# Patient Record
Sex: Female | Born: 1965 | Race: White | Hispanic: No | Smoking: Current every day smoker
Health system: Southern US, Community
[De-identification: ages and names within clinical notes are randomized; demographics above are authoritative.]

## PROBLEM LIST (undated history)

## (undated) DIAGNOSIS — I10 Essential (primary) hypertension: Secondary | ICD-10-CM

---

## 1997-08-12 ENCOUNTER — Other Ambulatory Visit: Admission: RE | Admit: 1997-08-12 | Discharge: 1997-08-12 | Payer: Self-pay | Admitting: Obstetrics and Gynecology

## 1998-03-31 ENCOUNTER — Inpatient Hospital Stay (HOSPITAL_COMMUNITY): Admission: AD | Admit: 1998-03-31 | Discharge: 1998-04-03 | Payer: Self-pay | Admitting: Obstetrics and Gynecology

## 1998-05-02 ENCOUNTER — Other Ambulatory Visit: Admission: RE | Admit: 1998-05-02 | Discharge: 1998-05-02 | Payer: Self-pay | Admitting: Obstetrics & Gynecology

## 1998-12-30 ENCOUNTER — Other Ambulatory Visit: Admission: RE | Admit: 1998-12-30 | Discharge: 1998-12-30 | Payer: Self-pay | Admitting: Gynecology

## 1999-04-24 ENCOUNTER — Encounter: Payer: Self-pay | Admitting: Emergency Medicine

## 1999-04-24 ENCOUNTER — Emergency Department (HOSPITAL_COMMUNITY): Admission: EM | Admit: 1999-04-24 | Discharge: 1999-04-24 | Payer: Self-pay | Admitting: Emergency Medicine

## 2000-06-21 ENCOUNTER — Other Ambulatory Visit: Admission: RE | Admit: 2000-06-21 | Discharge: 2000-06-21 | Payer: Self-pay | Admitting: Obstetrics and Gynecology

## 2001-01-10 ENCOUNTER — Inpatient Hospital Stay (HOSPITAL_COMMUNITY): Admission: AD | Admit: 2001-01-10 | Discharge: 2001-01-12 | Payer: Self-pay | Admitting: Obstetrics and Gynecology

## 2001-05-29 ENCOUNTER — Ambulatory Visit (HOSPITAL_BASED_OUTPATIENT_CLINIC_OR_DEPARTMENT_OTHER): Admission: RE | Admit: 2001-05-29 | Discharge: 2001-05-29 | Payer: Self-pay | Admitting: Plastic Surgery

## 2001-10-13 ENCOUNTER — Other Ambulatory Visit: Admission: RE | Admit: 2001-10-13 | Discharge: 2001-10-13 | Payer: Self-pay | Admitting: Obstetrics and Gynecology

## 2002-05-28 ENCOUNTER — Ambulatory Visit (HOSPITAL_COMMUNITY): Admission: RE | Admit: 2002-05-28 | Discharge: 2002-05-28 | Payer: Self-pay | Admitting: Obstetrics & Gynecology

## 2003-01-13 ENCOUNTER — Other Ambulatory Visit: Admission: RE | Admit: 2003-01-13 | Discharge: 2003-01-13 | Payer: Self-pay | Admitting: Obstetrics and Gynecology

## 2004-06-07 ENCOUNTER — Ambulatory Visit (HOSPITAL_COMMUNITY): Admission: RE | Admit: 2004-06-07 | Discharge: 2004-06-07 | Payer: Self-pay | Admitting: Obstetrics and Gynecology

## 2004-06-08 ENCOUNTER — Inpatient Hospital Stay (HOSPITAL_COMMUNITY): Admission: AD | Admit: 2004-06-08 | Discharge: 2004-06-10 | Payer: Self-pay | Admitting: Obstetrics and Gynecology

## 2004-07-11 ENCOUNTER — Other Ambulatory Visit: Admission: RE | Admit: 2004-07-11 | Discharge: 2004-07-11 | Payer: Self-pay | Admitting: Obstetrics and Gynecology

## 2010-05-17 ENCOUNTER — Other Ambulatory Visit: Payer: Self-pay | Admitting: Obstetrics and Gynecology

## 2011-02-02 ENCOUNTER — Emergency Department (HOSPITAL_COMMUNITY): Payer: BC Managed Care – PPO

## 2011-02-02 ENCOUNTER — Emergency Department (HOSPITAL_COMMUNITY)
Admission: EM | Admit: 2011-02-02 | Discharge: 2011-02-03 | Disposition: A | Discharge status: Home / Self Care | Payer: BC Managed Care – PPO | Attending: Emergency Medicine | Admitting: Emergency Medicine

## 2011-02-02 DIAGNOSIS — X500XXA Overexertion from strenuous movement or load, initial encounter: Secondary | ICD-10-CM | POA: Insufficient documentation

## 2011-02-02 DIAGNOSIS — S92309A Fracture of unspecified metatarsal bone(s), unspecified foot, initial encounter for closed fracture: Secondary | ICD-10-CM | POA: Insufficient documentation

## 2011-02-02 DIAGNOSIS — M79609 Pain in unspecified limb: Secondary | ICD-10-CM | POA: Insufficient documentation

## 2013-01-06 ENCOUNTER — Other Ambulatory Visit: Payer: Self-pay | Admitting: Obstetrics and Gynecology

## 2015-02-16 ENCOUNTER — Other Ambulatory Visit: Payer: Self-pay | Admitting: Obstetrics and Gynecology

## 2015-02-18 LAB — CYTOLOGY - PAP

## 2015-02-22 ENCOUNTER — Other Ambulatory Visit: Payer: Self-pay | Admitting: Obstetrics and Gynecology

## 2015-02-22 DIAGNOSIS — R928 Other abnormal and inconclusive findings on diagnostic imaging of breast: Secondary | ICD-10-CM

## 2015-02-28 ENCOUNTER — Ambulatory Visit
Admission: RE | Admit: 2015-02-28 | Discharge: 2015-02-28 | Disposition: A | Discharge status: Home / Self Care | Payer: BLUE CROSS/BLUE SHIELD | Source: Ambulatory Visit | Attending: Obstetrics and Gynecology | Admitting: Obstetrics and Gynecology

## 2015-02-28 DIAGNOSIS — R928 Other abnormal and inconclusive findings on diagnostic imaging of breast: Secondary | ICD-10-CM

## 2015-08-31 DIAGNOSIS — Z0000 Encounter for general adult medical examination without abnormal findings: Secondary | ICD-10-CM | POA: Diagnosis not present

## 2015-09-16 DIAGNOSIS — R5383 Other fatigue: Secondary | ICD-10-CM | POA: Diagnosis not present

## 2015-09-16 DIAGNOSIS — I10 Essential (primary) hypertension: Secondary | ICD-10-CM | POA: Diagnosis not present

## 2015-12-05 DIAGNOSIS — M545 Low back pain: Secondary | ICD-10-CM | POA: Diagnosis not present

## 2015-12-05 DIAGNOSIS — F419 Anxiety disorder, unspecified: Secondary | ICD-10-CM | POA: Diagnosis not present

## 2016-04-01 ENCOUNTER — Emergency Department (HOSPITAL_COMMUNITY): Payer: BLUE CROSS/BLUE SHIELD

## 2016-04-01 ENCOUNTER — Encounter (HOSPITAL_COMMUNITY): Payer: Self-pay | Admitting: Emergency Medicine

## 2016-04-01 ENCOUNTER — Emergency Department (HOSPITAL_COMMUNITY)
Admission: EM | Admit: 2016-04-01 | Discharge: 2016-04-01 | Disposition: A | Discharge status: Home / Self Care | Payer: BLUE CROSS/BLUE SHIELD | Attending: Emergency Medicine | Admitting: Emergency Medicine

## 2016-04-01 DIAGNOSIS — S8392XA Sprain of unspecified site of left knee, initial encounter: Secondary | ICD-10-CM | POA: Diagnosis not present

## 2016-04-01 DIAGNOSIS — I10 Essential (primary) hypertension: Secondary | ICD-10-CM | POA: Diagnosis not present

## 2016-04-01 DIAGNOSIS — F17210 Nicotine dependence, cigarettes, uncomplicated: Secondary | ICD-10-CM | POA: Insufficient documentation

## 2016-04-01 DIAGNOSIS — Y999 Unspecified external cause status: Secondary | ICD-10-CM | POA: Diagnosis not present

## 2016-04-01 DIAGNOSIS — X509XXA Other and unspecified overexertion or strenuous movements or postures, initial encounter: Secondary | ICD-10-CM | POA: Insufficient documentation

## 2016-04-01 DIAGNOSIS — S8992XA Unspecified injury of left lower leg, initial encounter: Secondary | ICD-10-CM | POA: Diagnosis not present

## 2016-04-01 DIAGNOSIS — S8391XA Sprain of unspecified site of right knee, initial encounter: Secondary | ICD-10-CM | POA: Diagnosis not present

## 2016-04-01 DIAGNOSIS — Y9301 Activity, walking, marching and hiking: Secondary | ICD-10-CM | POA: Insufficient documentation

## 2016-04-01 DIAGNOSIS — M25561 Pain in right knee: Secondary | ICD-10-CM | POA: Diagnosis not present

## 2016-04-01 DIAGNOSIS — Y929 Unspecified place or not applicable: Secondary | ICD-10-CM | POA: Diagnosis not present

## 2016-04-01 DIAGNOSIS — M25562 Pain in left knee: Secondary | ICD-10-CM | POA: Diagnosis not present

## 2016-04-01 HISTORY — DX: Essential (primary) hypertension: I10

## 2016-04-01 MED ORDER — IBUPROFEN 800 MG PO TABS: 800.0000 mg | ORAL_TABLET | Freq: Three times a day (TID) | ORAL | 0 refills | Status: AC

## 2016-04-01 NOTE — ED Notes (Signed)
Patient unable to tolerate automatic blood pressure cuff bilaterally, unable to get a blood pressure at this time.

## 2016-04-01 NOTE — ED Provider Notes (Signed)
[facility]-EMERGENCY DEPT Provider Note   CSN: [id number] Arrival date & time: 04/01/16  1232  By signing my name below, I, Donna SalisburyJoshua Wood, attest that this documentation has been prepared under the direction and in the presence of non-physician practitioner, Rhea BleacherJosh William Laske PA-C. Electronically Signed: Nelwyn SalisburyJoshua Wood, Scribe. 04/01/2016. 12:54 PM.  History   Chief Complaint Chief Complaint  Patient presents with  . Fall  . Knee Injury   The history is provided by the patient. No language interpreter was used.   HPI Comments:  Donna Wood is a 50 y.o. female with no pertinent pmhx who presents to the Emergency Department complaining of sudden-onset constant unchanged bilateral knee pain s/p fall occurring about 11 hours ago. Pt states that she was walking on a hardwood floor when her left knee gave out, causing her legs to slide from under her and putting her into a split. Pt's pain is exacerbated on walking and palpation. She denies hitting her head at the time of the fall. States she feels unstable on her knees and can only walk taking baby steps.    Past Medical History:  Diagnosis Date  . Hypertension     There are no active problems to display for this patient.   History reviewed. No pertinent surgical history.  OB History    No data available       Home Medications    Prior to Admission medications   Not on File    Family History No family history on file.  Social History Social History  Substance Use Topics  . Smoking status: Current Every Day Smoker    Types: Cigarettes  . Smokeless tobacco: Never Used  . Alcohol use Yes     Comment: once per week      Allergies   Patient has no known allergies.   Review of Systems Review of Systems  Constitutional: Negative for activity change.  Musculoskeletal: Positive for arthralgias and joint swelling. Negative for back pain and neck pain.  Skin: Negative for wound.  Neurological: Negative for weakness and  numbness.     Physical Exam Updated Vital Signs BP 118/78 (BP Location: Left Arm)   Pulse 106   Temp 97.7 F (36.5 C) (Oral)   Resp 18   LMP 04/01/2016   SpO2 97%   Physical Exam  Constitutional: She is oriented to person, place, and time. She appears well-developed and well-nourished. No distress.  HENT:  Head: Normocephalic and atraumatic.  Eyes: Conjunctivae are normal. Pupils are equal, round, and reactive to light.  Neck: Normal range of motion. Neck supple.  Cardiovascular: Normal rate and normal pulses.  Exam reveals no decreased pulses.   Pulmonary/Chest: Effort normal.  Abdominal: She exhibits no distension.  Musculoskeletal: She exhibits tenderness. She exhibits no edema.       Right hip: Normal.       Left hip: Normal.       Right knee: She exhibits normal range of motion and no swelling. Tenderness found. Medial joint line tenderness noted.       Left knee: She exhibits normal range of motion and no swelling. Tenderness found. Medial joint line tenderness noted.       Right ankle: Normal.       Left ankle: Normal.       Right foot: Normal.       Left foot: Normal.  Neurological: She is alert and oriented to person, place, and time. No sensory deficit.  Motor, sensation, and vascular distal to  the injury is fully intact.   Skin: Skin is warm and dry.  Psychiatric: She has a normal mood and affect.  Nursing note and vitals reviewed.    ED Treatments / Results  DIAGNOSTIC STUDIES:  Oxygen Saturation is 97% on RA, normal by my interpretation.    COORDINATION OF CARE:  1:41 PM Discussed treatment plan with pt at bedside which includes imaging and referral to orthopedics and pt agreed to plan.  Radiology Dg Knee Complete 4 Views Left  Result Date: 04/01/2016 CLINICAL DATA:  Left knee pain following an injury last night. EXAM: LEFT KNEE - COMPLETE 4+ VIEW COMPARISON:  None. FINDINGS: Mild tibial spine spur formation. No fracture, dislocation or effusion.  IMPRESSION: No fracture. Electronically Signed   By: Beckie SaltsSteven  Reid M.D.   On: 04/01/2016 13:57   Dg Knee Complete 4 Views Right  Result Date: 04/01/2016 CLINICAL DATA:  Right knee pain following an injury last night. EXAM: RIGHT KNEE - COMPLETE 4+ VIEW COMPARISON:  None. FINDINGS: Small effusion.  No fracture or dislocation seen. IMPRESSION: Small effusion.  No fracture. Electronically Signed   By: Beckie SaltsSteven  Reid M.D.   On: 04/01/2016 13:58    Procedures Procedures (including critical care time)   Initial Impression / Assessment and Plan / ED Course  I have reviewed the triage vital signs and the nursing notes.  Pertinent labs & imaging results that were available during my care of the patient were reviewed by me and considered in my medical decision making (see chart for details).  Clinical Course    Patient seen and examined. X-rays performed.   Vital signs reviewed and are as follows: BP 118/78 (BP Location: Left Arm)   Pulse 95   Temp 97.7 F (36.5 C) (Oral)   Resp 18   LMP 04/01/2016   SpO2 100%   Pt updated on results. No fractures. Will place in knee sleeves. Patient states that she has crutches at home. She is encouraged to use these to help her maintain her stability. Orthopedic referral given. Encouraged follow-up.  Patient was counseled on RICE protocol and told to rest injury, use ice for no longer than 15 minutes every hour, compress the area, and elevate above the level of their heart as much as possible to reduce swelling. Questions answered. Patient verbalized understanding.      Final Clinical Impressions(s) / ED Diagnoses   Final diagnoses:  Sprain of left knee, unspecified ligament, initial encounter  Sprain of right knee, unspecified ligament, initial encounter   Patient with knee injury bilaterally after fall. Imaging neg except for small effusion. LE's are neurovascularly intact. She will likely need ortho f/u 2/2 instability.   New  Prescriptions Discharge Medication List as of 04/01/2016  2:56 PM    START taking these medications   Details  ibuprofen (ADVIL,MOTRIN) 800 MG tablet Take 1 tablet (800 mg total) by mouth 3 (three) times daily., Starting Sun 04/01/2016, Print      I personally performed the services described in this documentation, which was scribed in my presence. The recorded information has been reviewed and is accurate.     Renne CriglerJoshua Kenzlei Runions, PA-C 04/01/16 1537    Melene Planan Floyd, DO 04/01/16 [phone]

## 2016-04-01 NOTE — ED Triage Notes (Signed)
Patient states last night left knee gave out from her causing her to do a split in her socks on hardwood floor.  Patient now c/o worsening left knee pain, new pain in right knee.

## 2016-04-01 NOTE — Discharge Instructions (Signed)
Please read and follow all provided instructions.  Your diagnoses today include:  1. Sprain of left knee, unspecified ligament, initial encounter   2. Sprain of right knee, unspecified ligament, initial encounter     Tests performed today include:  An x-ray of the affected areas - does NOT show any broken bones  Vital signs. See below for your results today.   Medications prescribed:   Ibuprofen (Motrin, Advil) - anti-inflammatory pain medication  Do not exceed 800mg  ibuprofen every 8 hours, take with food  You have been prescribed an anti-inflammatory medication or NSAID. Take with food. Take smallest effective dose for the shortest duration needed for your pain. Stop taking if you experience stomach pain or vomiting.   Take any prescribed medications only as directed.  Home care instructions:   Follow any educational materials contained in this packet  Follow R.I.C.E. Protocol:  R - rest your injury   I  - use ice on injury without applying directly to skin  C - compress injury with bandage or splint  E - elevate the injury as much as possible  Follow-up instructions: Please follow-up with your primary care provider or the provided orthopedic physician this coming week.   Return instructions:   Please return if your toes or feet are numb or tingling, appear gray or blue, or you have severe pain (also elevate the leg and loosen splint or wrap if you were given one)  Please return to the Emergency Department if you experience worsening symptoms.   Please return if you have any other emergent concerns.  Additional Information:  Your vital signs today were: BP 118/78 (BP Location: Left Arm)    Pulse 106    Temp 97.7 F (36.5 C) (Oral)    Resp 18    LMP 04/01/2016    SpO2 97%  If your blood pressure (BP) was elevated above 135/85 this visit, please have this repeated by your doctor within one month. --------------

## 2016-04-06 DIAGNOSIS — M25562 Pain in left knee: Secondary | ICD-10-CM | POA: Diagnosis not present

## 2016-04-06 DIAGNOSIS — M25561 Pain in right knee: Secondary | ICD-10-CM | POA: Diagnosis not present

## 2016-04-20 DIAGNOSIS — M25561 Pain in right knee: Secondary | ICD-10-CM | POA: Diagnosis not present

## 2016-05-21 DIAGNOSIS — M25561 Pain in right knee: Secondary | ICD-10-CM | POA: Diagnosis not present

## 2016-06-20 ENCOUNTER — Other Ambulatory Visit: Payer: Self-pay | Admitting: Obstetrics and Gynecology

## 2016-06-20 DIAGNOSIS — Z124 Encounter for screening for malignant neoplasm of cervix: Secondary | ICD-10-CM | POA: Diagnosis not present

## 2016-06-20 DIAGNOSIS — Z01419 Encounter for gynecological examination (general) (routine) without abnormal findings: Secondary | ICD-10-CM | POA: Diagnosis not present

## 2016-06-20 DIAGNOSIS — Z1231 Encounter for screening mammogram for malignant neoplasm of breast: Secondary | ICD-10-CM | POA: Diagnosis not present

## 2016-06-20 DIAGNOSIS — Z6831 Body mass index (BMI) 31.0-31.9, adult: Secondary | ICD-10-CM | POA: Diagnosis not present

## 2016-06-21 LAB — CYTOLOGY - PAP

## 2016-09-11 DIAGNOSIS — Z5181 Encounter for therapeutic drug level monitoring: Secondary | ICD-10-CM | POA: Diagnosis not present

## 2016-09-11 DIAGNOSIS — L299 Pruritus, unspecified: Secondary | ICD-10-CM | POA: Diagnosis not present

## 2016-09-11 DIAGNOSIS — L509 Urticaria, unspecified: Secondary | ICD-10-CM | POA: Diagnosis not present

## 2016-11-22 DIAGNOSIS — R635 Abnormal weight gain: Secondary | ICD-10-CM | POA: Diagnosis not present

## 2016-11-22 DIAGNOSIS — I10 Essential (primary) hypertension: Secondary | ICD-10-CM | POA: Diagnosis not present

## 2016-11-22 DIAGNOSIS — F419 Anxiety disorder, unspecified: Secondary | ICD-10-CM | POA: Diagnosis not present

## 2017-06-04 IMAGING — CR DG KNEE COMPLETE 4+V*R*
4 series · 4 of 4 positions shown · non-contrast
Comparison: None.

CLINICAL DATA: Right knee pain following an injury last night.

EXAM:
RIGHT KNEE - COMPLETE 4+ VIEW

[t knee ap right]
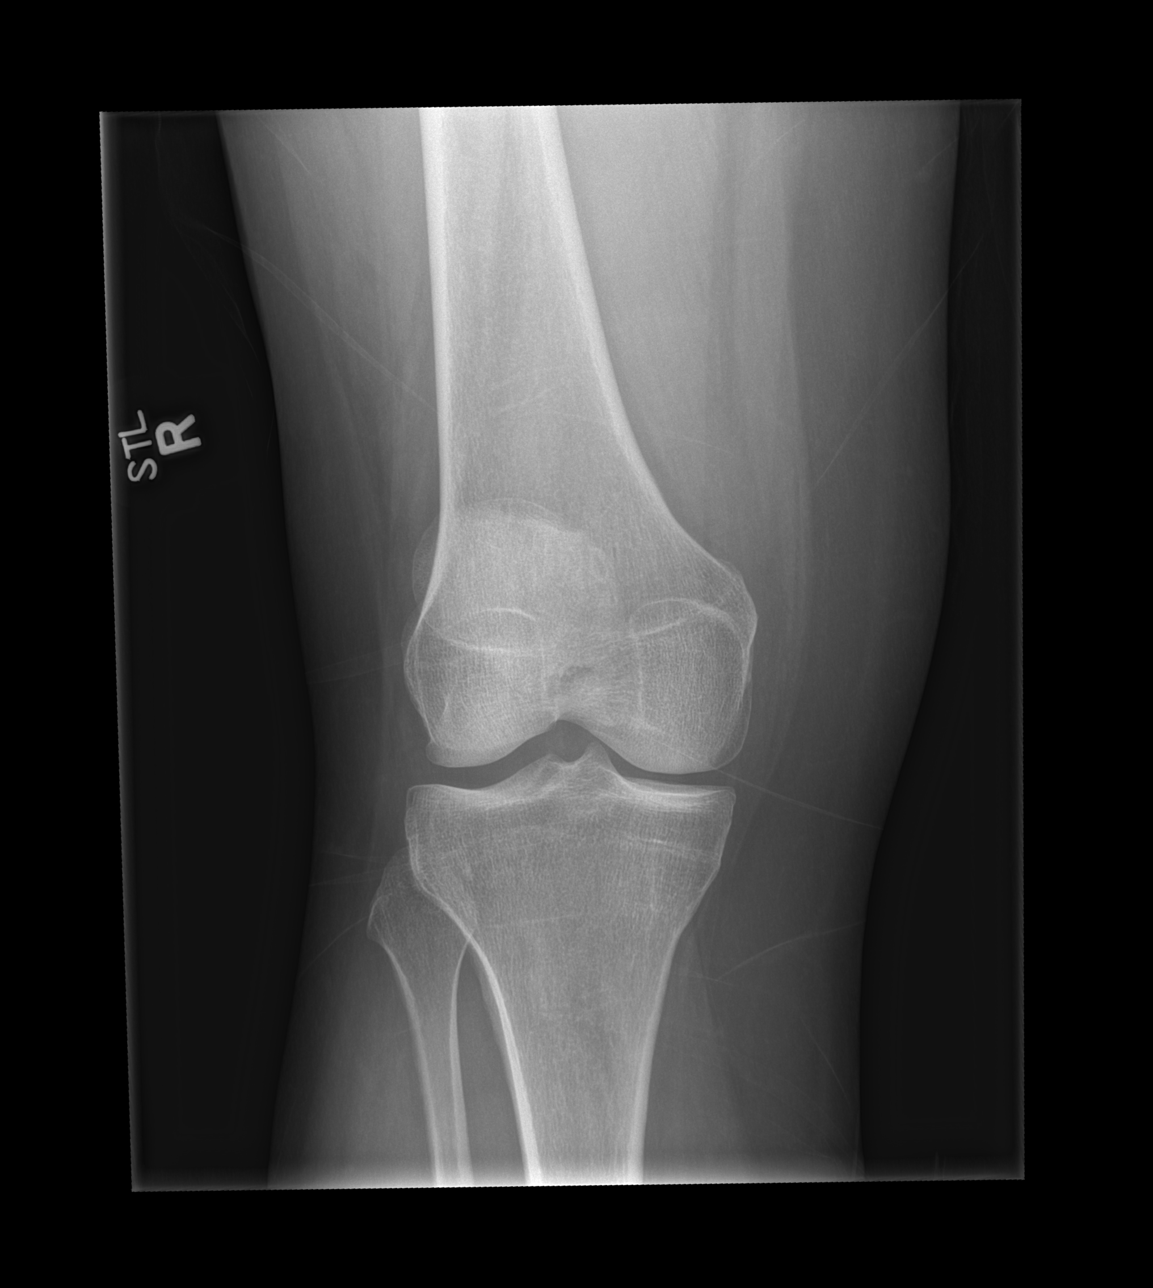

[t knee obl right (1 of 2)]
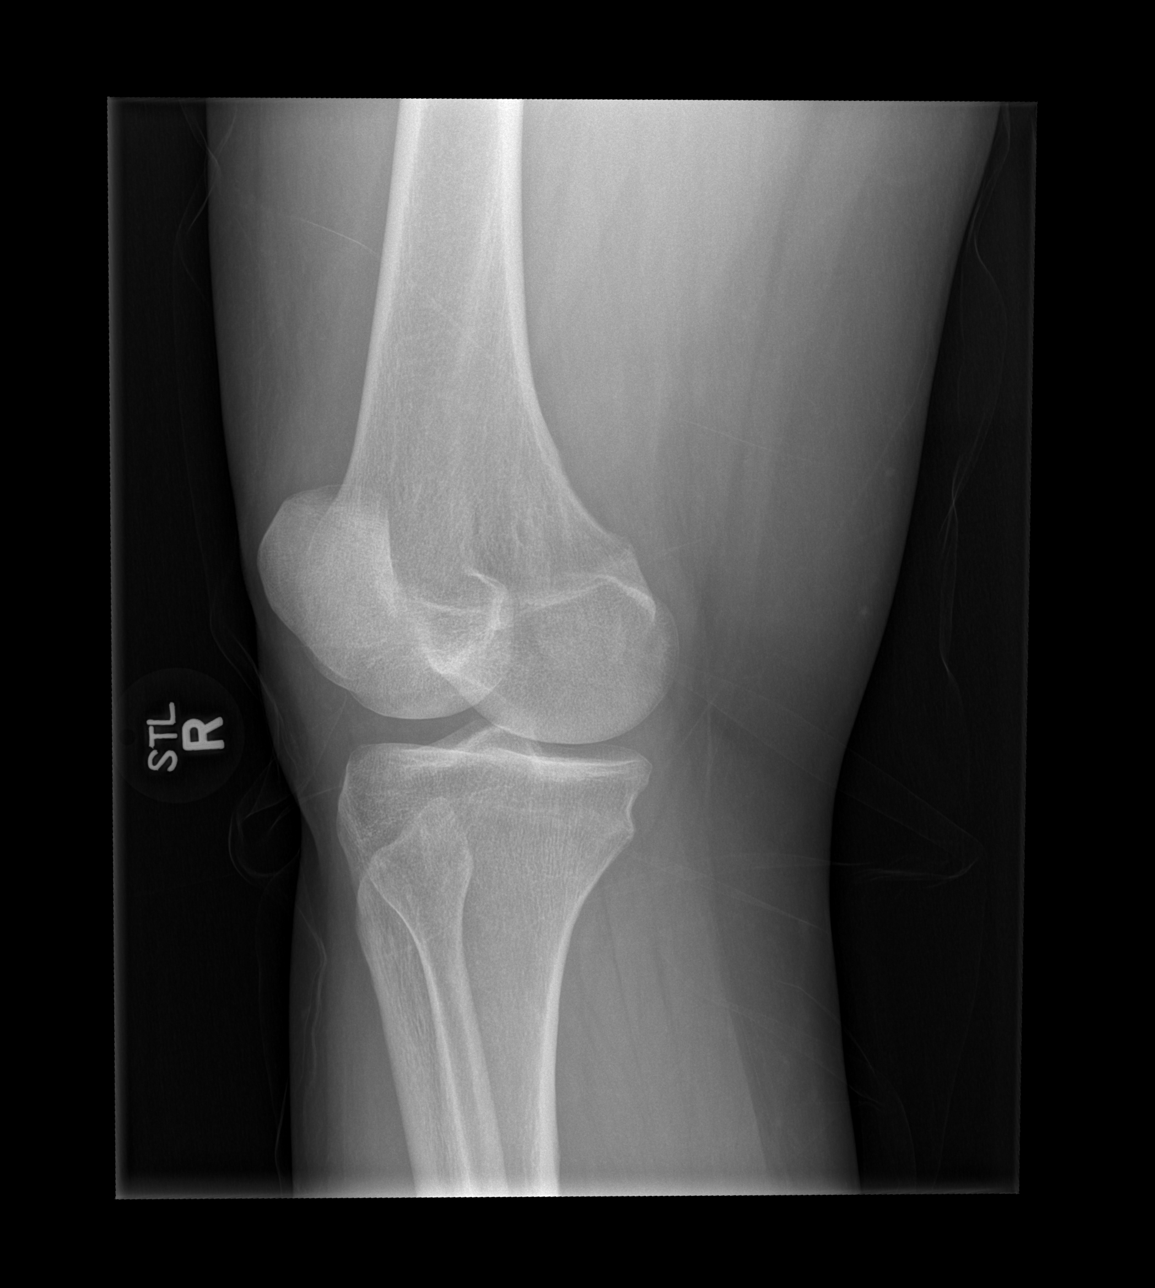

[t knee obl right (2 of 2)]
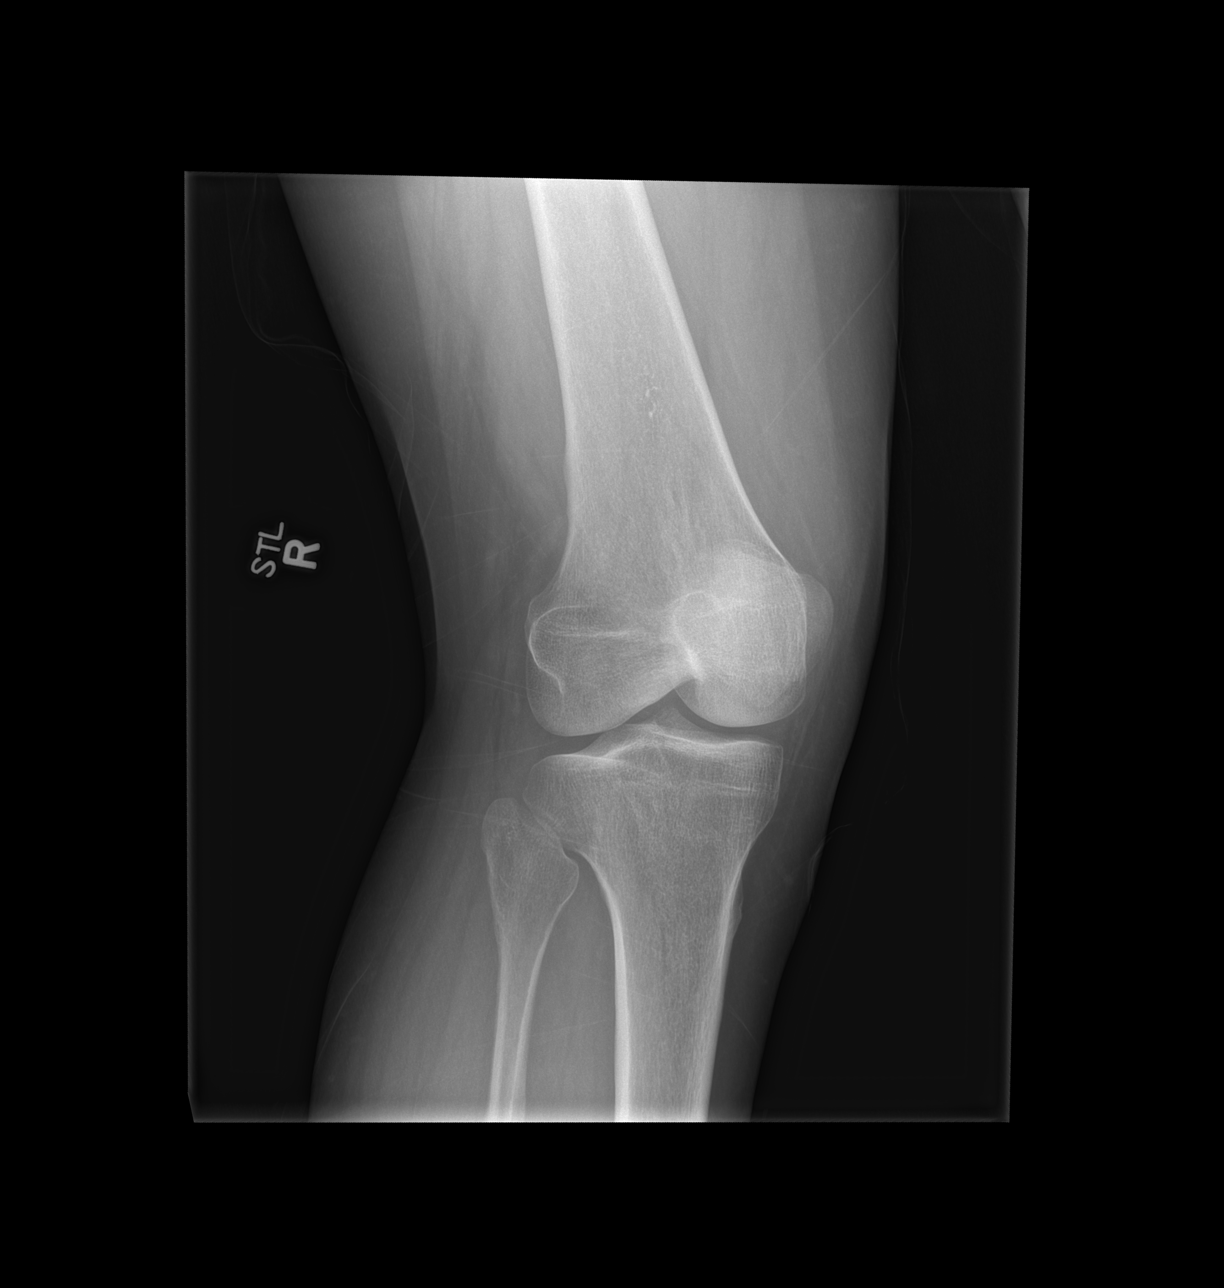

[x knee lat right]
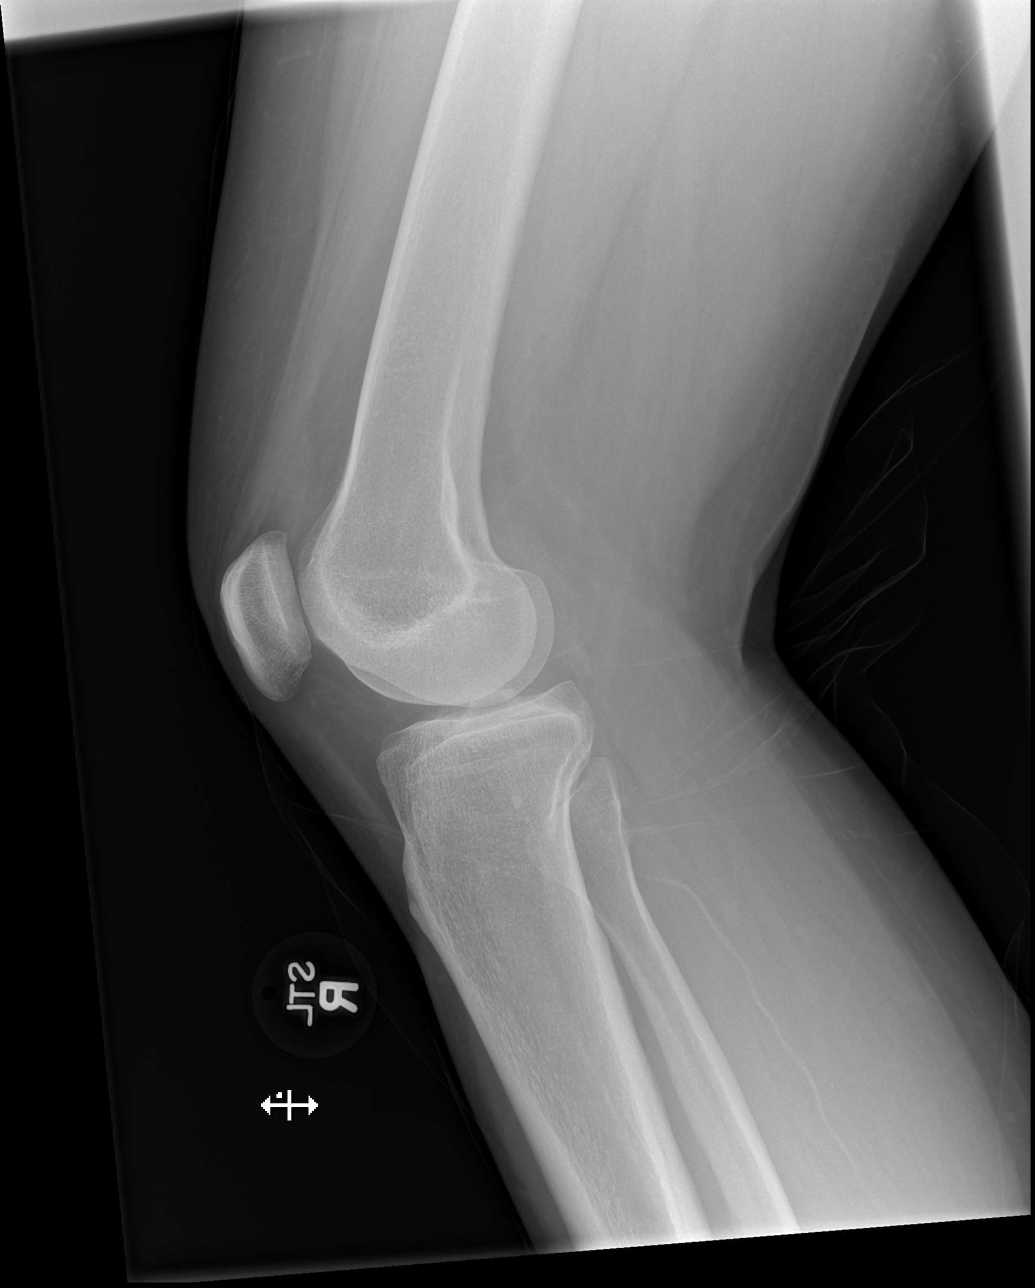

[4 of 4 positions shown; findings below may reference images not displayed]

FINDINGS: Small effusion.  No fracture or dislocation seen.
IMPRESSION: Small effusion.  No fracture.

## 2017-07-25 DIAGNOSIS — R7301 Impaired fasting glucose: Secondary | ICD-10-CM | POA: Diagnosis not present

## 2017-07-25 DIAGNOSIS — I10 Essential (primary) hypertension: Secondary | ICD-10-CM | POA: Diagnosis not present

## 2017-07-25 DIAGNOSIS — Z0000 Encounter for general adult medical examination without abnormal findings: Secondary | ICD-10-CM | POA: Diagnosis not present

## 2017-07-25 DIAGNOSIS — F419 Anxiety disorder, unspecified: Secondary | ICD-10-CM | POA: Diagnosis not present

## 2017-12-20 DIAGNOSIS — L509 Urticaria, unspecified: Secondary | ICD-10-CM | POA: Diagnosis not present

## 2018-01-22 DIAGNOSIS — I10 Essential (primary) hypertension: Secondary | ICD-10-CM | POA: Diagnosis not present

## 2018-01-22 DIAGNOSIS — R7301 Impaired fasting glucose: Secondary | ICD-10-CM | POA: Diagnosis not present

## 2018-01-22 DIAGNOSIS — F419 Anxiety disorder, unspecified: Secondary | ICD-10-CM | POA: Diagnosis not present

## 2018-01-22 DIAGNOSIS — E559 Vitamin D deficiency, unspecified: Secondary | ICD-10-CM | POA: Diagnosis not present

## 2018-03-18 ENCOUNTER — Ambulatory Visit (INDEPENDENT_AMBULATORY_CARE_PROVIDER_SITE_OTHER): Payer: BLUE CROSS/BLUE SHIELD | Admitting: Podiatry

## 2018-03-18 ENCOUNTER — Ambulatory Visit (INDEPENDENT_AMBULATORY_CARE_PROVIDER_SITE_OTHER): Payer: BLUE CROSS/BLUE SHIELD

## 2018-03-18 ENCOUNTER — Encounter: Payer: Self-pay | Admitting: Podiatry

## 2018-03-18 VITALS — BP 149/86 | HR 65 | Resp 16 | Ht 63.0 in | Wt 190.0 lb

## 2018-03-18 DIAGNOSIS — M722 Plantar fascial fibromatosis: Secondary | ICD-10-CM | POA: Diagnosis not present

## 2018-03-18 MED ORDER — METHYLPREDNISOLONE 4 MG PO TBPK: ORAL_TABLET | ORAL | 0 refills | Status: AC

## 2018-03-18 MED ORDER — MELOXICAM 15 MG PO TABS: 15.0000 mg | ORAL_TABLET | Freq: Every day | ORAL | 3 refills | Status: AC

## 2018-03-18 NOTE — Progress Notes (Signed)
  Subjective:  Patient ID: Donna Wood, female    DOB: 18-May-1965,  MRN: [id number] HPI Chief Complaint  Patient presents with  . Foot Pain    Right foot; bottom of heel; pt stated, "After labor day, did boot camp for one day and then my foot started to hurt; standing for long periods of time hurts my foot; when I stay off my foot, it helps"; x6 weeks    52 y.o. female presents with the above complaint.   ROS: Denies fever chills nausea vomiting muscle aches pains calf pain back pain chest pain shortness of breath.  Past Medical History:  Diagnosis Date  . Hypertension    History reviewed. No pertinent surgical history.  Current Outpatient Medications:  .  buPROPion (WELLBUTRIN XL) 150 MG 24 hr tablet, TK 1 T PO QD IN THE MORNING, Disp: , Rfl: 1 .  metoprolol succinate (TOPROL-XL) 50 MG 24 hr tablet, metoprolol succinate ER 50 mg tablet,extended release 24 hr, Disp: , Rfl:  .  Multiple Vitamins-Minerals (MULTIVITAMIN PO), Take 1 tablet by mouth daily., Disp: , Rfl:  .  ibuprofen (ADVIL,MOTRIN) 800 MG tablet, Take 1 tablet (800 mg total) by mouth 3 (three) times daily., Disp: 21 tablet, Rfl: 0 .  meloxicam (MOBIC) 15 MG tablet, Take 1 tablet (15 mg total) by mouth daily., Disp: 30 tablet, Rfl: 3 .  methylPREDNISolone (MEDROL DOSEPAK) 4 MG TBPK tablet, 6 day dose pack - take as directed, Disp: 21 tablet, Rfl: 0  No Known Allergies Review of Systems Objective:   Vitals:   03/18/18 0830  BP: (!) 149/86  Pulse: 65  Resp: 16    General: Well developed, nourished, in no acute distress, alert and oriented x3   Dermatological: Skin is warm, dry and supple bilateral. Nails x 10 are well maintained; remaining integument appears unremarkable at this time. There are no open sores, no preulcerative lesions, no rash or signs of infection present.  Vascular: Dorsalis Pedis artery and Posterior Tibial artery pedal pulses are 2/4 bilateral with immedate capillary fill time. Pedal hair  growth present. No varicosities and no lower extremity edema present bilateral.   Neruologic: Grossly intact via light touch bilateral. Vibratory intact via tuning fork bilateral. Protective threshold with Semmes Wienstein monofilament intact to all pedal sites bilateral. Patellar and Achilles deep tendon reflexes 2+ bilateral. No Babinski or clonus noted bilateral.   Musculoskeletal: No gross boney pedal deformities bilateral. No pain, crepitus, or limitation noted with foot and ankle range of motion bilateral. Muscular strength 5/5 in all groups tested bilateral.  Pain on palpation medial calcaneal tubercle of the right heel.  Gait: Unassisted, Nonantalgic.    Radiographs:  Radiographs taken today demonstrate plantar distally oriented calcaneal heel spur with soft tissue increase in density plantar fashion calcaneal insertion site of the right heel.  No acute findings.  Assessment & Plan:   Assessment: Plantar fasciitis.  Plan: Discussed etiology pathology conservative or surgical therapies after sterile Betadine skin prep I injected 20 mg Kenalog 5 mg Marcaine point maximal tenderness of the right heel.  Tolerated procedure well without complications.  I started her on a Medrol Dosepak to be followed by meloxicam.  Placed her plantar fascial brace and a night splint.  Discussed appropriate shoe gear stretching exercise ice therapy sugar modifications.     Troyce Febo T. HarmonHyatt, North DakotaDPM

## 2018-03-18 NOTE — Patient Instructions (Signed)

## 2018-04-17 ENCOUNTER — Ambulatory Visit: Payer: BLUE CROSS/BLUE SHIELD | Admitting: Podiatry

## 2018-05-05 DIAGNOSIS — Z8371 Family history of colonic polyps: Secondary | ICD-10-CM | POA: Diagnosis not present

## 2018-05-05 DIAGNOSIS — D120 Benign neoplasm of cecum: Secondary | ICD-10-CM | POA: Diagnosis not present

## 2018-05-05 DIAGNOSIS — D123 Benign neoplasm of transverse colon: Secondary | ICD-10-CM | POA: Diagnosis not present

## 2018-05-05 DIAGNOSIS — Z1211 Encounter for screening for malignant neoplasm of colon: Secondary | ICD-10-CM | POA: Diagnosis not present

## 2018-05-05 DIAGNOSIS — K5730 Diverticulosis of large intestine without perforation or abscess without bleeding: Secondary | ICD-10-CM | POA: Diagnosis not present

## 2018-05-07 DIAGNOSIS — D120 Benign neoplasm of cecum: Secondary | ICD-10-CM | POA: Diagnosis not present

## 2018-05-07 DIAGNOSIS — D123 Benign neoplasm of transverse colon: Secondary | ICD-10-CM | POA: Diagnosis not present

## 2018-10-13 DIAGNOSIS — E559 Vitamin D deficiency, unspecified: Secondary | ICD-10-CM | POA: Diagnosis not present

## 2018-10-13 DIAGNOSIS — F419 Anxiety disorder, unspecified: Secondary | ICD-10-CM | POA: Diagnosis not present

## 2018-10-13 DIAGNOSIS — R7301 Impaired fasting glucose: Secondary | ICD-10-CM | POA: Diagnosis not present

## 2018-10-13 DIAGNOSIS — I10 Essential (primary) hypertension: Secondary | ICD-10-CM | POA: Diagnosis not present

## 2019-04-06 DIAGNOSIS — I10 Essential (primary) hypertension: Secondary | ICD-10-CM | POA: Diagnosis not present

## 2019-04-06 DIAGNOSIS — R7301 Impaired fasting glucose: Secondary | ICD-10-CM | POA: Diagnosis not present

## 2019-04-06 DIAGNOSIS — F419 Anxiety disorder, unspecified: Secondary | ICD-10-CM | POA: Diagnosis not present

## 2019-04-06 DIAGNOSIS — E559 Vitamin D deficiency, unspecified: Secondary | ICD-10-CM | POA: Diagnosis not present

## 2019-04-13 DIAGNOSIS — Z1231 Encounter for screening mammogram for malignant neoplasm of breast: Secondary | ICD-10-CM | POA: Diagnosis not present

## 2019-04-13 DIAGNOSIS — Z6832 Body mass index (BMI) 32.0-32.9, adult: Secondary | ICD-10-CM | POA: Diagnosis not present

## 2019-04-13 DIAGNOSIS — Z124 Encounter for screening for malignant neoplasm of cervix: Secondary | ICD-10-CM | POA: Diagnosis not present

## 2019-04-13 DIAGNOSIS — Z01419 Encounter for gynecological examination (general) (routine) without abnormal findings: Secondary | ICD-10-CM | POA: Diagnosis not present

## 2019-08-25 DIAGNOSIS — L255 Unspecified contact dermatitis due to plants, except food: Secondary | ICD-10-CM | POA: Diagnosis not present

## 2020-03-16 DIAGNOSIS — F419 Anxiety disorder, unspecified: Secondary | ICD-10-CM | POA: Diagnosis not present

## 2020-03-16 DIAGNOSIS — R7301 Impaired fasting glucose: Secondary | ICD-10-CM | POA: Diagnosis not present

## 2020-03-16 DIAGNOSIS — E559 Vitamin D deficiency, unspecified: Secondary | ICD-10-CM | POA: Diagnosis not present

## 2020-03-16 DIAGNOSIS — I10 Essential (primary) hypertension: Secondary | ICD-10-CM | POA: Diagnosis not present

## 2020-03-29 DIAGNOSIS — I10 Essential (primary) hypertension: Secondary | ICD-10-CM | POA: Diagnosis not present

## 2020-03-29 DIAGNOSIS — R7301 Impaired fasting glucose: Secondary | ICD-10-CM | POA: Diagnosis not present

## 2021-09-13 DIAGNOSIS — I10 Essential (primary) hypertension: Secondary | ICD-10-CM | POA: Diagnosis not present

## 2021-09-13 DIAGNOSIS — E559 Vitamin D deficiency, unspecified: Secondary | ICD-10-CM | POA: Diagnosis not present

## 2021-09-13 DIAGNOSIS — F419 Anxiety disorder, unspecified: Secondary | ICD-10-CM | POA: Diagnosis not present

## 2021-09-13 DIAGNOSIS — R7301 Impaired fasting glucose: Secondary | ICD-10-CM | POA: Diagnosis not present

## 2022-01-19 DIAGNOSIS — Z01419 Encounter for gynecological examination (general) (routine) without abnormal findings: Secondary | ICD-10-CM | POA: Diagnosis not present

## 2022-01-19 DIAGNOSIS — Z6833 Body mass index (BMI) 33.0-33.9, adult: Secondary | ICD-10-CM | POA: Diagnosis not present

## 2022-01-19 DIAGNOSIS — Z124 Encounter for screening for malignant neoplasm of cervix: Secondary | ICD-10-CM | POA: Diagnosis not present

## 2022-01-19 DIAGNOSIS — Z1231 Encounter for screening mammogram for malignant neoplasm of breast: Secondary | ICD-10-CM | POA: Diagnosis not present

## 2022-01-22 ENCOUNTER — Other Ambulatory Visit: Payer: Self-pay | Admitting: Obstetrics and Gynecology

## 2022-01-22 DIAGNOSIS — R928 Other abnormal and inconclusive findings on diagnostic imaging of breast: Secondary | ICD-10-CM

## 2022-02-06 ENCOUNTER — Ambulatory Visit
Admission: RE | Admit: 2022-02-06 | Discharge: 2022-02-06 | Disposition: A | Discharge status: Home / Self Care | Payer: BLUE CROSS/BLUE SHIELD | Source: Ambulatory Visit | Attending: Obstetrics and Gynecology | Admitting: Obstetrics and Gynecology

## 2022-02-06 ENCOUNTER — Other Ambulatory Visit: Payer: Self-pay | Admitting: Obstetrics and Gynecology

## 2022-02-06 ENCOUNTER — Ambulatory Visit
Admission: RE | Admit: 2022-02-06 | Discharge: 2022-02-06 | Disposition: A | Discharge status: Home / Self Care | Payer: BC Managed Care – PPO | Source: Ambulatory Visit | Attending: Obstetrics and Gynecology | Admitting: Obstetrics and Gynecology

## 2022-02-06 DIAGNOSIS — R928 Other abnormal and inconclusive findings on diagnostic imaging of breast: Secondary | ICD-10-CM

## 2022-02-06 DIAGNOSIS — N6489 Other specified disorders of breast: Secondary | ICD-10-CM

## 2022-02-06 DIAGNOSIS — N6320 Unspecified lump in the left breast, unspecified quadrant: Secondary | ICD-10-CM

## 2022-02-23 ENCOUNTER — Other Ambulatory Visit: Payer: Self-pay | Admitting: Obstetrics and Gynecology

## 2022-02-23 ENCOUNTER — Ambulatory Visit
Admission: RE | Admit: 2022-02-23 | Discharge: 2022-02-23 | Disposition: A | Discharge status: Home / Self Care | Payer: BC Managed Care – PPO | Source: Ambulatory Visit | Attending: Obstetrics and Gynecology | Admitting: Obstetrics and Gynecology

## 2022-02-23 DIAGNOSIS — N6011 Diffuse cystic mastopathy of right breast: Secondary | ICD-10-CM | POA: Diagnosis not present

## 2022-02-23 DIAGNOSIS — N6320 Unspecified lump in the left breast, unspecified quadrant: Secondary | ICD-10-CM

## 2022-02-23 DIAGNOSIS — N6489 Other specified disorders of breast: Secondary | ICD-10-CM

## 2022-02-23 DIAGNOSIS — R928 Other abnormal and inconclusive findings on diagnostic imaging of breast: Secondary | ICD-10-CM | POA: Diagnosis not present

## 2022-08-27 ENCOUNTER — Other Ambulatory Visit: Payer: Self-pay | Admitting: Obstetrics and Gynecology

## 2022-08-27 ENCOUNTER — Ambulatory Visit
Admission: RE | Admit: 2022-08-27 | Discharge: 2022-08-27 | Disposition: A | Discharge status: Home / Self Care | Payer: BC Managed Care – PPO | Source: Ambulatory Visit | Attending: Obstetrics and Gynecology | Admitting: Obstetrics and Gynecology

## 2022-08-27 DIAGNOSIS — N6320 Unspecified lump in the left breast, unspecified quadrant: Secondary | ICD-10-CM

## 2022-08-27 DIAGNOSIS — Z1231 Encounter for screening mammogram for malignant neoplasm of breast: Secondary | ICD-10-CM | POA: Diagnosis not present

## 2022-09-17 DIAGNOSIS — I10 Essential (primary) hypertension: Secondary | ICD-10-CM | POA: Diagnosis not present

## 2022-09-17 DIAGNOSIS — F419 Anxiety disorder, unspecified: Secondary | ICD-10-CM | POA: Diagnosis not present

## 2022-09-17 DIAGNOSIS — R7301 Impaired fasting glucose: Secondary | ICD-10-CM | POA: Diagnosis not present

## 2022-09-17 DIAGNOSIS — E559 Vitamin D deficiency, unspecified: Secondary | ICD-10-CM | POA: Diagnosis not present

## 2023-08-12 ENCOUNTER — Ambulatory Visit
Admission: RE | Admit: 2023-08-12 | Discharge: 2023-08-12 | Disposition: A | Discharge status: Home / Self Care | Source: Ambulatory Visit | Attending: Obstetrics and Gynecology | Admitting: Obstetrics and Gynecology

## 2023-08-12 ENCOUNTER — Ambulatory Visit: Admission: RE | Admit: 2023-08-12 | Source: Ambulatory Visit

## 2023-08-12 DIAGNOSIS — N6320 Unspecified lump in the left breast, unspecified quadrant: Secondary | ICD-10-CM
